# Patient Record
Sex: Male | Born: 1995 | Race: White | Hispanic: No | Marital: Single | Smoking: Never smoker
Health system: Southern US, Community
[De-identification: ages and names within clinical notes are randomized; demographics above are authoritative.]

## PROBLEM LIST (undated history)

## (undated) HISTORY — PX: TONSILLECTOMY: SUR1361

---
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
## 2017-04-26 ENCOUNTER — Encounter (HOSPITAL_COMMUNITY): Payer: Self-pay

## 2017-04-26 ENCOUNTER — Emergency Department (HOSPITAL_COMMUNITY): Payer: 59

## 2017-04-26 ENCOUNTER — Emergency Department (HOSPITAL_COMMUNITY)
Admission: EM | Admit: 2017-04-26 | Discharge: 2017-04-26 | Disposition: A | Discharge status: Home / Self Care | Payer: 59 | Attending: Emergency Medicine | Admitting: Emergency Medicine

## 2017-04-26 DIAGNOSIS — Y9367 Activity, basketball: Secondary | ICD-10-CM | POA: Diagnosis not present

## 2017-04-26 DIAGNOSIS — S93401A Sprain of unspecified ligament of right ankle, initial encounter: Secondary | ICD-10-CM | POA: Diagnosis not present

## 2017-04-26 DIAGNOSIS — Y92310 Basketball court as the place of occurrence of the external cause: Secondary | ICD-10-CM | POA: Insufficient documentation

## 2017-04-26 DIAGNOSIS — Y998 Other external cause status: Secondary | ICD-10-CM | POA: Insufficient documentation

## 2017-04-26 DIAGNOSIS — X509XXA Other and unspecified overexertion or strenuous movements or postures, initial encounter: Secondary | ICD-10-CM | POA: Diagnosis not present

## 2017-04-26 DIAGNOSIS — S99911A Unspecified injury of right ankle, initial encounter: Secondary | ICD-10-CM | POA: Diagnosis present

## 2017-04-26 MED ORDER — TRAMADOL HCL 50 MG PO TABS: 50.0000 mg | ORAL_TABLET | Freq: Four times a day (QID) | ORAL | 0 refills | Status: DC | PRN

## 2017-04-26 MED ORDER — IBUPROFEN 800 MG PO TABS: 800.0000 mg | ORAL_TABLET | Freq: Three times a day (TID) | ORAL | 0 refills | Status: DC

## 2017-04-26 NOTE — ED Triage Notes (Signed)
Rolled his right ankle while playing basketball last evening.  Pain and swelling present.

## 2017-04-26 NOTE — ED Notes (Signed)
Pt took 800mg  Motrin apprx @ 0015 this morning for the pain.

## 2017-04-26 NOTE — ED Provider Notes (Signed)
[facility] Provider Note   CSN: [id number] Arrival date & time: 04/26/17  0054     History   Chief Complaint Chief Complaint  Patient presents with  . Ankle Injury    HPI Charles Chung is a 21 y.o. male.  Patient presents for evaluation of right ankle injury.  Patient reports he was playing basketball approximately 5 hours ago when he rolled his right ankle.  Patient reports that it he heard a crunch when he rolled it and he has been having severe pain since.  He cannot bear weight because of pain.      History reviewed. No pertinent past medical history.  There are no active problems to display for this patient.   Past Surgical History:  Procedure Laterality Date  . TONSILLECTOMY         Home Medications    Prior to Admission medications   Medication Sig Start Date End Date Taking? Authorizing Provider  diphenhydrAMINE (BENADRYL) 25 MG tablet Take 25 mg by mouth every 6 (six) hours as needed. For itching/swelling    [provider]  ibuprofen (ADVIL,MOTRIN) 800 MG tablet Take 1 tablet (800 mg total) by mouth 3 (three) times daily. 04/26/17   Gilda CreasePollina, Amariyana Heacox J, MD  Poison Ivy Treatments GEL Apply 1 application topically 3 (three) times daily as needed. For itching    [provider]  predniSONE (DELTASONE) 20 MG tablet 3 Tabs PO Days 1-3, then 2 tabs PO Days 4-6, then 1 tab PO Day 7-9, then Half Tab PO Day 10-12 04/09/15   Renne CriglerGeiple, Joshua, PA-C  traMADol (ULTRAM) 50 MG tablet Take 1 tablet (50 mg total) by mouth every 6 (six) hours as needed. 04/26/17   Gilda CreasePollina, Rosabell Geyer J, MD    Family History No family history on file.  Social History Social History   Tobacco Use  . Smoking status: Never Smoker  . Smokeless tobacco: Never Used  Substance Use Topics  . Alcohol use: No  . Drug use: No     Allergies   Patient has no known allergies.   Review of Systems Review of Systems  Musculoskeletal: Positive  for arthralgias.  Skin: Negative.      Physical Exam Updated Vital Signs BP (!) 137/55 (BP Location: Right Arm)   Pulse 93   Temp 99.1 F (37.3 C) (Oral)   Resp 15   Ht 6\' 1"  (1.854 m)   Wt 99.8 kg (220 lb)   SpO2 100%   BMI 29.03 kg/m   Physical Exam  Constitutional: He appears well-nourished.  HENT:  Head: Atraumatic.  Eyes: EOM are normal.  Pulmonary/Chest: Effort normal.  Musculoskeletal:       Right knee: Normal. He exhibits no swelling. No tenderness found.       Right ankle: He exhibits decreased range of motion and swelling. He exhibits no deformity. Tenderness. No lateral malleolus, no medial malleolus and no proximal fibula tenderness found. Achilles tendon normal. Achilles tendon exhibits normal Thompson's test results.       Feet:     ED Treatments / Results  Labs (all labs ordered are listed, but only abnormal results are displayed) Labs Reviewed - No data to display  EKG  EKG Interpretation None       Radiology Dg Ankle Complete Right  Result Date: 04/26/2017 CLINICAL DATA:  Pain following rolling injury EXAM: RIGHT ANKLE - COMPLETE 3+ VIEW COMPARISON:  None. FINDINGS: Frontal, oblique, and lateral views were obtained. There is mild soft tissue  swelling. There is an apparent tiny avulsion arising from the lateral malleolus. No other evident fracture. No joint effusion. No appreciable joint space narrowing or erosion. Ankle mortise appears intact. IMPRESSION: Soft tissue swelling. Tiny avulsion arising from lateral malleolus inferiorly. No other evidence suggesting fracture. Ankle mortise appears intact. Electronically Signed   By: Bretta BangWilliam  Woodruff III M.D.   On: 04/26/2017 01:29    Procedures Procedures (including critical care time)  Medications Ordered in ED Medications - No data to display   Initial Impression / Assessment and Plan / ED Course  I have reviewed the triage vital signs and the nursing notes.  Pertinent labs & imaging results  that were available during my care of the patient were reviewed by me and considered in my medical decision making (see chart for details).     Presents to the ER for evaluation of inversion ankle injury.  Patient with anterior ankle tenderness and swelling, no fifth metatarsal tenderness, no proximal fibula tenderness, no malleolar tenderness.  X-ray negative with possible tiny avulsion off of lateral malleolus, although he is particularly tender there.  Will place in a Cam walker and have follow-up with orthopedics.  Final Clinical Impressions(s) / ED Diagnoses   Final diagnoses:  Sprain of right ankle, unspecified ligament, initial encounter    ED Discharge Orders        Ordered    ibuprofen (ADVIL,MOTRIN) 800 MG tablet  3 times daily     04/26/17 0133    traMADol (ULTRAM) 50 MG tablet  Every 6 hours PRN     04/26/17 0133       Gilda CreasePollina, Evalynn Hankins J, MD 04/26/17 [phone]

## 2020-10-04 ENCOUNTER — Emergency Department (HOSPITAL_COMMUNITY): Payer: 59

## 2020-10-04 ENCOUNTER — Other Ambulatory Visit: Payer: Self-pay

## 2020-10-04 ENCOUNTER — Encounter (HOSPITAL_COMMUNITY): Payer: Self-pay | Admitting: *Deleted

## 2020-10-04 ENCOUNTER — Emergency Department (HOSPITAL_COMMUNITY)
Admission: EM | Admit: 2020-10-04 | Discharge: 2020-10-04 | Disposition: A | Discharge status: Home / Self Care | Payer: 59 | Attending: Emergency Medicine | Admitting: Emergency Medicine

## 2020-10-04 DIAGNOSIS — K5900 Constipation, unspecified: Secondary | ICD-10-CM | POA: Insufficient documentation

## 2020-10-04 NOTE — ED Triage Notes (Signed)
Last BM 2 days ago

## 2020-10-04 NOTE — ED Provider Notes (Signed)
[facility] EMERGENCY DEPARTMENT Provider Note   CSN: [id number] Arrival date & time: 10/04/20  1339     History Chief Complaint  Patient presents with  . Constipation    Charles Chung is a 25 y.o. male.  The history is provided by the patient.  Constipation Severity:  Moderate Time since last bowel movement:  2 weeks Timing:  Intermittent Progression:  Waxing and waning Chronicity:  New Context: dietary changes   Stool description: hard, then watery after laxative. Relieved by:  Stool softeners and laxatives Associated symptoms: no abdominal pain, no anorexia, no back pain, no diarrhea, no dysuria, no fever, no flatus, no nausea, no urinary retention and no vomiting  Hematochezia: once.        History reviewed. No pertinent past medical history.  There are no problems to display for this patient.   Past Surgical History:  Procedure Laterality Date  . TONSILLECTOMY         No family history on file.  Social History   Tobacco Use  . Smoking status: Never Smoker  . Smokeless tobacco: Never Used  Substance Use Topics  . Alcohol use: No  . Drug use: No    Home Medications Prior to Admission medications   Medication Sig Start Date End Date Taking? Authorizing Provider  diphenhydrAMINE (BENADRYL) 25 MG tablet Take 25 mg by mouth every 6 (six) hours as needed. For itching/swelling    [provider]  ibuprofen (ADVIL,MOTRIN) 800 MG tablet Take 1 tablet (800 mg total) by mouth 3 (three) times daily. 04/26/17   Gilda Crease, MD  Poison Ivy Treatments GEL Apply 1 application topically 3 (three) times daily as needed. For itching    [provider]  predniSONE (DELTASONE) 20 MG tablet 3 Tabs PO Days 1-3, then 2 tabs PO Days 4-6, then 1 tab PO Day 7-9, then Half Tab PO Day 10-12 04/09/15   Renne Crigler, PA-C  traMADol (ULTRAM) 50 MG tablet Take 1 tablet (50 mg total) by mouth every 6 (six) hours as needed. 04/26/17   Gilda Crease, MD    Allergies    Patient has no known allergies.  Review of Systems   Review of Systems  Constitutional: Negative for fever.  Gastrointestinal: Positive for constipation. Negative for abdominal pain, anorexia, diarrhea, flatus, nausea and vomiting. Hematochezia: once.  Genitourinary: Negative for dysuria.  Musculoskeletal: Negative for back pain.    Physical Exam Updated Vital Signs BP (!) 150/70 (BP Location: Right Arm)   Pulse 70   Temp 98.7 F (37.1 C) (Oral)   Resp 18   Ht 6' (1.829 m)   Wt 96.6 kg   SpO2 100%   BMI 28.89 kg/m   Physical Exam Vitals and nursing note reviewed.  Constitutional:      General: He is not in acute distress.    Appearance: He is well-developed. He is not diaphoretic.  HENT:     Head: Normocephalic and atraumatic.  Eyes:     General: No scleral icterus.    Conjunctiva/sclera: Conjunctivae normal.  Cardiovascular:     Rate and Rhythm: Normal rate and regular rhythm.     Heart sounds: Normal heart sounds.  Pulmonary:     Effort: Pulmonary effort is normal. No respiratory distress.     Breath sounds: Normal breath sounds.  Abdominal:     Palpations: Abdomen is soft.     Tenderness: There is no abdominal tenderness.  Musculoskeletal:     Cervical back: Normal range  of motion and neck supple.  Skin:    General: Skin is warm and dry.  Neurological:     Mental Status: He is alert.  Psychiatric:        Behavior: Behavior normal.     ED Results / Procedures / Treatments   Labs (all labs ordered are listed, but only abnormal results are displayed) Labs Reviewed - No data to display  EKG None  Radiology No results found.  Procedures Procedures   Medications Ordered in ED Medications - No data to display  ED Course  I have reviewed the triage vital signs and the nursing notes.  Pertinent labs & imaging results that were available during my care of the patient were reviewed by me and considered in my  medical decision making (see chart for details).    MDM Rules/Calculators/A&P                          Patient with c/o constipation. Large stool burden on plain film No evidence of bowel obstruction. Patient given multiple otc  Med recommendations, lifestyle and diet choices, f/u with GI if no improvement. Discussed return precautions.  Final Clinical Impression(s) / ED Diagnoses Final diagnoses:  None    Rx / DC Orders ED Discharge Orders    None       Arthor Captain, PA-C 10/04/20 1614    Vanetta Mulders, MD 10/06/20 [phone]

## 2020-10-04 NOTE — Discharge Instructions (Addendum)
Ultimate Cleanse by Nature's Secret (my recommendation) available  at [employer], whole foods, or on amazon Or Miralax- 1 cap in 10 oz water 3 times a day, or for a clean out 10 capfuls in 32 oz of water before bed.  INCREASE FIBROUS fruits and veggies INCREASE cardio (running and walking stimulate colonic contraction) INCREASE water to at least 100 oz of water daily

## 2021-10-05 IMAGING — DX DG ABDOMEN 1V
2 series · 2 of 2 positions shown · non-contrast
Comparison: Abdomen report 05/12/1998.

CLINICAL DATA: Constipation.  Bloating.

EXAM:
ABDOMEN - 1 VIEW

[abdomen kub (1 of 2)]
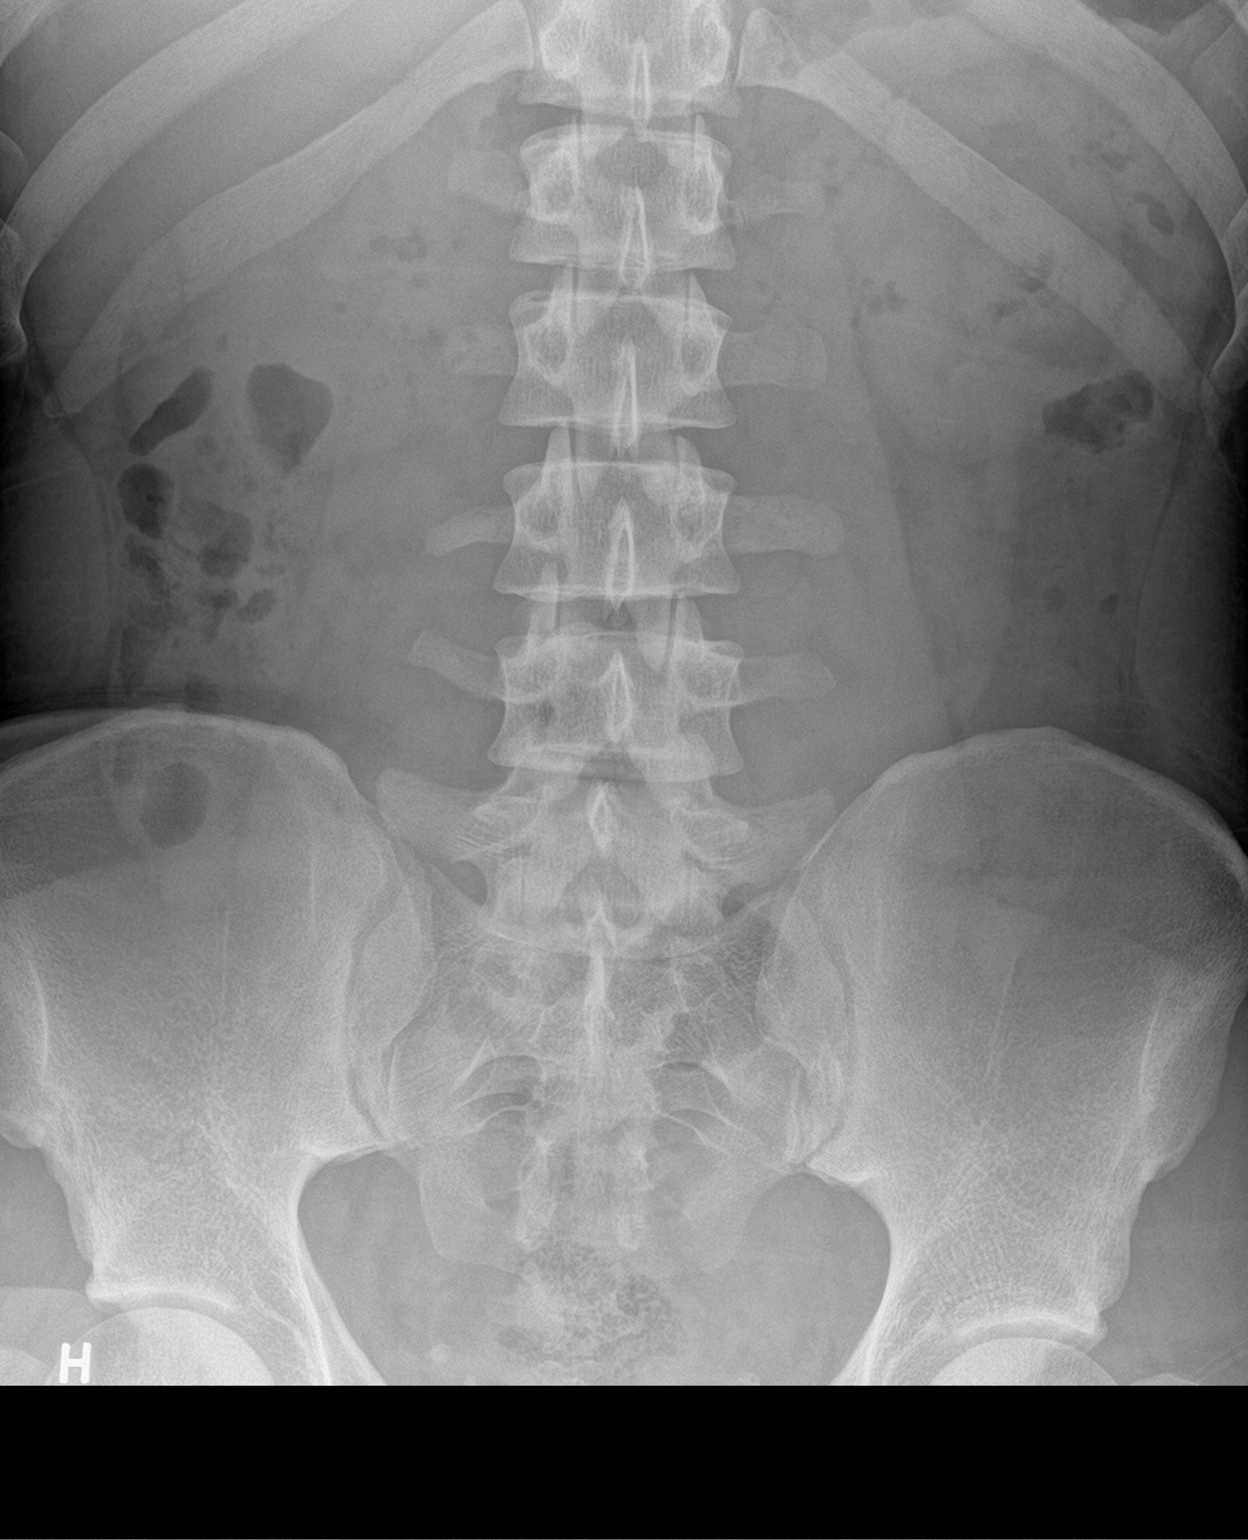

[abdomen kub (2 of 2)]
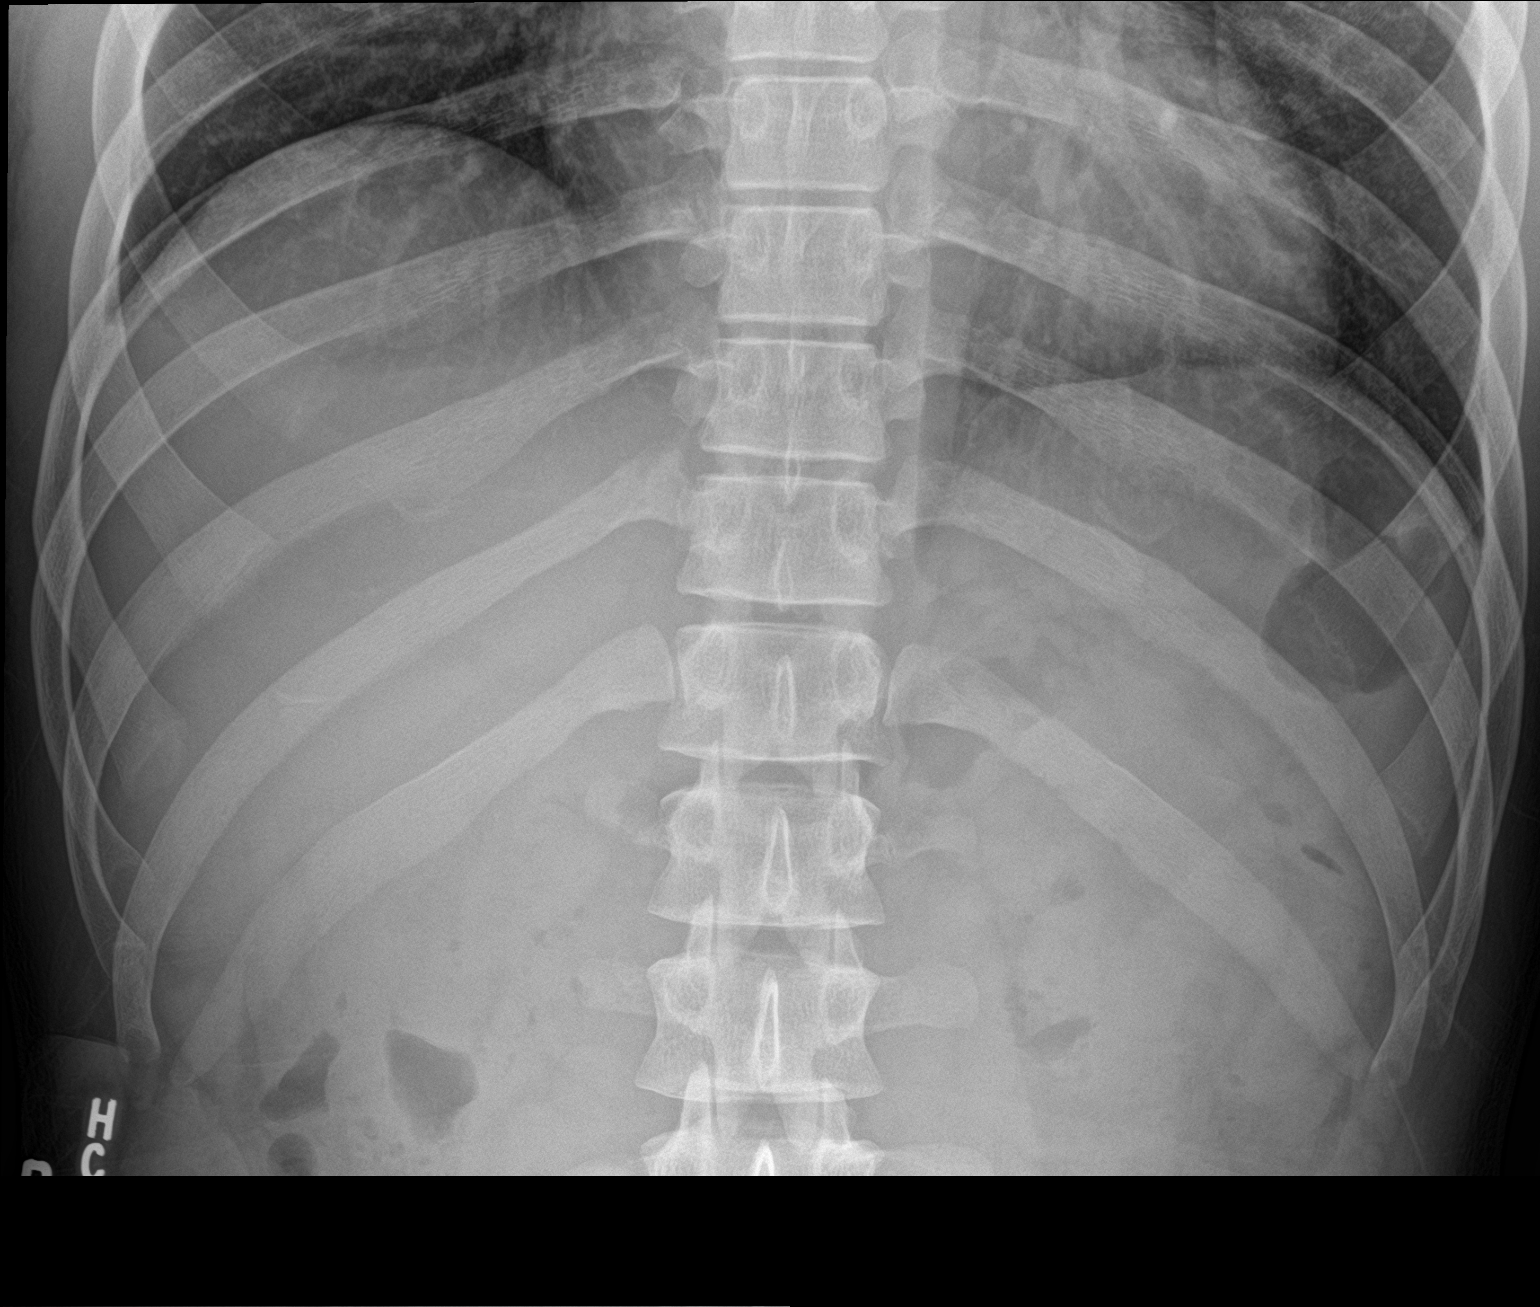

[2 of 2 positions shown; findings below may reference images not displayed]

FINDINGS: Soft tissue structures are unremarkable. No bowel distention. No
free air. Large amount of stool noted throughout the colon. Pelvic
calcifications consistent phleboliths. No acute bony abnormality.
IMPRESSION: Large amount of stool noted throughout the colon. No bowel
distention. No acute abnormality.

## 2022-08-30 ENCOUNTER — Ambulatory Visit (INDEPENDENT_AMBULATORY_CARE_PROVIDER_SITE_OTHER): Payer: Self-pay | Admitting: Podiatry

## 2022-08-30 ENCOUNTER — Encounter: Payer: Self-pay | Admitting: Podiatry

## 2022-08-30 ENCOUNTER — Ambulatory Visit (INDEPENDENT_AMBULATORY_CARE_PROVIDER_SITE_OTHER): Payer: 59

## 2022-08-30 DIAGNOSIS — M722 Plantar fascial fibromatosis: Secondary | ICD-10-CM

## 2022-08-30 DIAGNOSIS — D492 Neoplasm of unspecified behavior of bone, soft tissue, and skin: Secondary | ICD-10-CM

## 2022-08-30 DIAGNOSIS — M79672 Pain in left foot: Secondary | ICD-10-CM

## 2022-08-30 MED ORDER — TRIAMCINOLONE ACETONIDE 10 MG/ML IJ SUSP
10.0000 mg | Freq: Once | INTRAMUSCULAR | Status: AC
  Administered 2022-08-30: 10 mg

## 2022-08-30 NOTE — Progress Notes (Signed)
Subjective:   Patient ID: Charles Chung, male   DOB: 27 y.o.   MRN: [id number]   HPI Patient is with a small nodule plantar aspect left arch that is been present for 3 years and states it does get discomforting with pressure and it seems to be worse over the last few months.  Has not really seen it grow and it only gets irritated with standing.  Patient does not smoke likes to be active   Review of Systems  All other systems reviewed and are negative.       Objective:  Physical Exam Vitals and nursing note reviewed.  Constitutional:      Appearance: He is well-developed.  Pulmonary:     Effort: Pulmonary effort is normal.  Musculoskeletal:        General: Normal range of motion.  Skin:    General: Skin is warm.  Neurological:     Mental Status: He is alert.     Neurovascular status intact muscle strength found to be adequate range of motion adequate with patient found to have a small nodule within the left plantar arch that measures around 7 mm x 8 mm.  It does not appear to have any density to it and it does appear to be just inflammatory with no proximal issues or other pathology     Assessment:  Difficult to say what this may be but it appears to be more inflammatory and it is not really changed for 3 years     Plan:  H&P reviewed MRI of mass going to hold off currently, to try to get it better and if it responds we will just watch it and I went ahead today did do sterile prep injected the plantar fascia left 3 mg Dexasone Kenalog 5 mg Xylocaine and I went ahead and discussed if any changes were to occur or if it continues to hurt but I want to see it immediately.  Also begin warm compresses at home and I applied padding around the area to try to offload the discomfort.  Patient will be seen back as symptoms indicate hopefully this will solve the problem  X-rays indicate no calcification no indications of bony extensions or pathology

## 2022-09-04 ENCOUNTER — Other Ambulatory Visit: Payer: Self-pay | Admitting: Podiatry

## 2022-09-04 DIAGNOSIS — M722 Plantar fascial fibromatosis: Secondary | ICD-10-CM

## 2022-09-04 DIAGNOSIS — D492 Neoplasm of unspecified behavior of bone, soft tissue, and skin: Secondary | ICD-10-CM

## 2022-09-04 DIAGNOSIS — M79672 Pain in left foot: Secondary | ICD-10-CM

## 2022-10-29 ENCOUNTER — Ambulatory Visit: Payer: 59 | Admitting: Podiatry

## 2022-10-29 ENCOUNTER — Encounter: Payer: Self-pay | Admitting: Podiatry

## 2022-10-29 DIAGNOSIS — D1801 Hemangioma of skin and subcutaneous tissue: Secondary | ICD-10-CM | POA: Diagnosis not present

## 2022-10-30 NOTE — Progress Notes (Signed)
Subjective:   Patient ID: Charles Chung, male   DOB: 27 y.o.   MRN: [id number]   HPI Patient states he is still having problems with the bottom of his left foot and stated the injection only helped for a very temporary period of time and it is hard to walk on   ROS      Objective:  Physical Exam  Neurovascular status intact with the patient's left foot showing a discoloration and bulge in the plantar metatarsal proximal to the third metatarsal it is localized and sore to this area no proximal edema erythema drainage noted     Assessment:  Appears to be some form of cyst or other unknown soft tissue mass and I did show to Dr. Logan Bores who agreed and we agreed together that it would probably be best to open it up to make sure it is not infected     Plan:  H&P reviewed went ahead today did proximal nerve block and did nerve block of the whole plantar foot.  Then using a sharp sterile chisel I opened up the area a very small amount and actually found that there is fresh blood in the area which did have indications of some form of a AV fistula and I did at this time applied massive compression to get it to stop instructed on elevation and if it were to start again he is getting need to go to the emergency room and may need vascular surgery.  If this does not resolve problems what I did he may need to see a vascular surgeon also and have this area clotted and worked on and possibly cauterized or tied off.  Patient had significant dressing applied and that it was no longer bleeding when he left but he will be seen back immediately or let us know if any issues were to occur

## 2022-12-21 ENCOUNTER — Encounter: Payer: Self-pay | Admitting: Podiatry

## 2022-12-21 ENCOUNTER — Ambulatory Visit: Payer: 59 | Admitting: Podiatry

## 2022-12-21 DIAGNOSIS — I770 Arteriovenous fistula, acquired: Secondary | ICD-10-CM

## 2022-12-24 NOTE — Progress Notes (Signed)
Subjective:   Patient ID: Charles Chung, male   DOB: 27 y.o.   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  Reviewed this with him

## 2023-01-11 ENCOUNTER — Ambulatory Visit
Admission: RE | Admit: 2023-01-11 | Discharge: 2023-01-11 | Disposition: A | Discharge status: Home / Self Care | Payer: 59 | Source: Ambulatory Visit | Attending: Podiatry

## 2023-01-11 ENCOUNTER — Other Ambulatory Visit: Payer: Self-pay | Admitting: Podiatry

## 2023-01-11 DIAGNOSIS — I770 Arteriovenous fistula, acquired: Secondary | ICD-10-CM

## 2023-01-11 MED ORDER — GADOPICLENOL 0.5 MMOL/ML IV SOLN
10.0000 mL | Freq: Once | INTRAVENOUS | Status: AC | PRN
  Administered 2023-01-11: 10 mL via INTRAVENOUS

## 2023-02-25 NOTE — Progress Notes (Signed)
Kid who needs av malformation worked on

## 2023-02-26 ENCOUNTER — Telehealth: Payer: Self-pay | Admitting: Podiatry

## 2023-02-26 NOTE — Telephone Encounter (Signed)
-----   Message from Edwin Cap sent at 02/25/2023  8:56 AM EDT ----- Can you schedule him a f/u appt w/ me for surgery consult when Regal is here? ----- Message ----- From: Lenn Sink, DPM Sent: 02/25/2023   8:40 AM EDT To: Edwin Cap, DPM  Kid who needs av malformation worked on

## 2023-02-26 NOTE — Telephone Encounter (Signed)
Lvm for pt to call to schedule an appt to see Dr Lilian Kapur when Dr Charlsie Merles is also in the office.

## 2023-03-11 ENCOUNTER — Ambulatory Visit: Payer: 59 | Admitting: Podiatry

## 2023-03-11 ENCOUNTER — Encounter: Payer: Self-pay | Admitting: Podiatry

## 2023-03-11 DIAGNOSIS — Q278 Other specified congenital malformations of peripheral vascular system: Secondary | ICD-10-CM | POA: Diagnosis not present

## 2023-03-11 DIAGNOSIS — D492 Neoplasm of unspecified behavior of bone, soft tissue, and skin: Secondary | ICD-10-CM

## 2023-03-11 NOTE — Progress Notes (Signed)
Subjective:  Patient ID: Charles Chung, male    DOB: 1995-12-17,  MRN: [id number]  Chief Complaint  Patient presents with   Foot Pain    "It's doing fine now.  Once or twice I had the sharp pain that I was having when I first came here."    27 y.o. male presents with the above complaint. History confirmed with patient.  He is referred to me by Dr. Charlsie Merles for a soft tissue mass of the plantar left foot.  He said he has had it to some degree present for a number of years but has acutely worsened over the last couple months  Objective:  Physical Exam: warm, good capillary refill, no trophic changes or ulcerative lesions, normal DP and PT pulses, normal sensory exam, and plantar midfoot in the mid arch there is a soft tender soft tissue mass with ecchymotic mottled appearance.        Study Result  Narrative & Impression  CLINICAL DATA:  AV fistula of the left forefoot   EXAM: MRI OF THE LEFT FOREFOOT WITHOUT AND WITH CONTRAST   TECHNIQUE: Multiplanar, multisequence MR imaging of the left forefoot was performed both before and after administration of intravenous contrast.   CONTRAST:  10 mL Vueway, 0 mL wasted   COMPARISON:  None Available.   FINDINGS: Bones/Joint/Cartilage   No fracture or dislocation. Normal alignment. No joint effusion. No marrow signal abnormality.   Ligaments   Collateral ligaments are intact.  Lisfranc ligament is intact.   Muscles and Tendons   Flexor, peroneal and extensor compartment tendons are intact. Muscles are normal.   Soft tissue No fluid collection or hematoma. 1.3 x 0.8 x 3.1 cm lobulated T2 hyperintense mass with enhancement isointense to the adjacent vessels consistent with a vascular malformation. Low signal areas within the vascular malformation likely reflecting thrombosis or phleboliths.   IMPRESSION: 1. A 1.3 x 0.8 x 3.1 cm lobulated mass with enhancement isointense to the adjacent vessels consistent with a  vascular malformation. Low signal areas within the vascular malformation likely reflecting thrombosis or phleboliths.     Electronically Signed   By: Elige Ko M.D.   On: 01/29/2023 12:16   Assessment:   1. 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  Reviewed the results of the MRI.  We discussed the presence of the vascular malformation and the etiology and treatment options of these lesions and that they are typically benign lesions that have a low chance of recurrence with excision.  Surgical we discussed excision of the lesion through a direct plantar approach and we discussed due to the location the need for this and the need for limited weightbearing and use of crutches for approximately 3 weeks while his sutures heal.  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  We discussed the risk of recurrence the risk of infection and the risk of painful scar and/or nerve entrapment from surgery.  We also discussed the need for additional incisions to tie off any feeder veins.  All questions addressed.  Informed consent signed and reviewed.   Surgical plan:  Procedure: -Excision of vascular soft tissue mass left foot  Location: -[facility]  Anesthesia plan: -IV sedation with local  Postoperative pain plan: - Tylenol 1000 mg every 6 hours, ibuprofen 600 mg every  6 hours, gabapentin 300 mg every 8 hours x5 days, oxycodone 5 mg 1-2 tabs every 6 hours only as needed  DVT prophylaxis: -None required  WB Restrictions / DME needs: -NWB in surgical shoe with crutches   No follow-ups on file.

## 2023-04-15 ENCOUNTER — Telehealth: Payer: Self-pay | Admitting: Podiatry

## 2023-04-15 NOTE — Telephone Encounter (Signed)
DOS-05/17/23  EXC. SOFT TISSUE MASS [id number]  [facility] EFFECTIVE DATE-05/07/2022  DEDUCTIBLE- $500.00 WITH REMAINING $500.00 OOP- $3000.00 WITH REMAINING $2850.00 COINSURANCE- 20%  PER THE [employer] WEBSITE PORTAL, PRIOR AUTH IS NOT REQUIRED FOR CPT CODE 40981.  AUTH Decision ID #: [id number]

## 2023-04-15 NOTE — Telephone Encounter (Signed)
Completed and e-mailed FMLA paperwork to Mr. Yongue employer - [email or web address]  .Marland Kitchen...  Called patient and advised the same .Marland Kitchen...    J. Abbott -- 04/15/2023

## 2023-05-17 ENCOUNTER — Other Ambulatory Visit: Payer: Self-pay | Admitting: Podiatry

## 2023-05-17 DIAGNOSIS — D492 Neoplasm of unspecified behavior of bone, soft tissue, and skin: Secondary | ICD-10-CM | POA: Diagnosis not present

## 2023-05-17 DIAGNOSIS — Q278 Other specified congenital malformations of peripheral vascular system: Secondary | ICD-10-CM | POA: Diagnosis not present

## 2023-05-17 DIAGNOSIS — M79672 Pain in left foot: Secondary | ICD-10-CM

## 2023-05-17 MED ORDER — GABAPENTIN 300 MG PO CAPS: 300.0000 mg | ORAL_CAPSULE | Freq: Three times a day (TID) | ORAL | 0 refills | Status: DC

## 2023-05-17 MED ORDER — ACETAMINOPHEN 500 MG PO TABS: 1000.0000 mg | ORAL_TABLET | Freq: Four times a day (QID) | ORAL | 0 refills | Status: AC | PRN

## 2023-05-17 MED ORDER — TRAMADOL HCL 50 MG PO TABS: 50.0000 mg | ORAL_TABLET | Freq: Four times a day (QID) | ORAL | 0 refills | Status: AC | PRN

## 2023-05-17 MED ORDER — IBUPROFEN 600 MG PO TABS: 600.0000 mg | ORAL_TABLET | Freq: Four times a day (QID) | ORAL | 0 refills | Status: AC | PRN

## 2023-05-17 NOTE — Progress Notes (Signed)
 05/17/23 Mass excision

## 2023-05-23 ENCOUNTER — Ambulatory Visit (INDEPENDENT_AMBULATORY_CARE_PROVIDER_SITE_OTHER): Payer: 59 | Admitting: Podiatry

## 2023-05-23 ENCOUNTER — Encounter: Payer: Self-pay | Admitting: Podiatry

## 2023-05-23 VITALS — Ht 72.0 in | Wt 213.0 lb

## 2023-05-23 DIAGNOSIS — Q278 Other specified congenital malformations of peripheral vascular system: Secondary | ICD-10-CM

## 2023-05-23 NOTE — Progress Notes (Signed)
  Subjective:  Patient ID: Charles Chung, male    DOB: 05-Oct-1995,  MRN: [id number]  Chief Complaint  Patient presents with   Routine Post Op    POV # 1 DOS 05/17/23 --- EXC OF LEFT FOOT SOFT TISSUE MASS     28 y.o. male returns for post-op check.  Doing well he has not had much pain  Review of Systems: Negative except as noted in the HPI. Denies N/V/F/Ch.   Objective:  There were no vitals filed for this visit. Body mass index is 28.89 kg/m. Constitutional Well developed. Well nourished.  Vascular Foot warm and well perfused. Capillary refill normal to all digits.  Calf is soft and supple, no posterior calf or knee pain, negative Homans' sign  Neurologic Normal speech. Oriented to person, place, and time. Epicritic sensation to light touch grossly present bilaterally.  Dermatologic Skin healing well without signs of infection. Skin edges well coapted without signs of infection.  Orthopedic: Minimal pain to palpation noted about the surgical site.    Assessment:   1. Vascular malformation of lower extremity    Plan:  Patient was evaluated and treated and all questions answered.  S/p foot surgery left -Progressing as expected post-operatively.  Continue nonweightbearing in CAM boot.  Foot redressed.  May shower on Monday.  Return in 2 weeks to have sutures removed.   Return in about 2 weeks (around 06/06/2023) for suture removal.

## 2023-05-27 ENCOUNTER — Other Ambulatory Visit: Payer: Self-pay | Admitting: Podiatry

## 2023-06-03 ENCOUNTER — Telehealth: Payer: Self-pay | Admitting: Podiatry

## 2023-06-03 NOTE — Telephone Encounter (Signed)
Patient called as requested from Dr. Lilian Kapur if any incident occurs to call immediately/Patient fell on left foot.

## 2023-06-06 ENCOUNTER — Encounter: Payer: Self-pay | Admitting: Podiatry

## 2023-06-06 ENCOUNTER — Ambulatory Visit (INDEPENDENT_AMBULATORY_CARE_PROVIDER_SITE_OTHER): Payer: 59 | Admitting: Podiatry

## 2023-06-06 ENCOUNTER — Telehealth: Payer: Self-pay | Admitting: Podiatry

## 2023-06-06 VITALS — Ht 72.0 in | Wt 213.0 lb

## 2023-06-06 DIAGNOSIS — D1801 Hemangioma of skin and subcutaneous tissue: Secondary | ICD-10-CM

## 2023-06-06 NOTE — Progress Notes (Signed)
  Subjective:  Patient ID: Charles Chung, male    DOB: 02/03/1996,  MRN: [id number]  Chief Complaint  Patient presents with   Routine Post Op    POV # 2 DOS 05/17/23 --- EXC OF LEFT FOOT SOFT TISSUE MASS, he reports he felt a pop after a fall the other day like a stitch come out     28 y.o. male returns for post-op check.  He is concerned about an area in the middle of the incision  Review of Systems: Negative except as noted in the HPI. Denies N/V/F/Ch.   Objective:  There were no vitals filed for this visit. Body mass index is 28.89 kg/m. Constitutional Well developed. Well nourished.  Vascular Foot warm and well perfused. Capillary refill normal to all digits.  Calf is soft and supple, no posterior calf or knee pain, negative Homans' sign  Neurologic Normal speech. Oriented to person, place, and time. Epicritic sensation to light touch grossly present bilaterally.  Dermatologic Majority of well-healed and not hypertrophic.  The central portion of incision has slight maceration and no deep dehiscence  Orthopedic: Minimal pain to palpation noted about the surgical site.   Pathology results showed hemangioma of left foot benign  Assessment:   1. Hemangioma of subcutaneous tissue     Plan:  Patient was evaluated and treated and all questions answered.  S/p foot surgery left -Most sutures removed.  Unfortunate has delayed healing in the central portion.  Recommended he discontinue use of Aquaphor and apply Betadine to stitches daily.  Return in 1 week to have removed we will have to delay his return to work until the Monday after next visit on February 10   Return in about 1 week (around 06/13/2023) for suture removal.

## 2023-06-06 NOTE — Telephone Encounter (Signed)
Pt called and stated you wanted him to be out of work for another week but he forgot to get a letter. When are you wanting the pt to go back to work? Please advise

## 2023-06-06 NOTE — Telephone Encounter (Signed)
Pt wanted letter emailed to him at [email or web address].  I have emailed to pt.

## 2023-06-13 ENCOUNTER — Encounter: Payer: Self-pay | Admitting: Podiatry

## 2023-06-13 ENCOUNTER — Ambulatory Visit (INDEPENDENT_AMBULATORY_CARE_PROVIDER_SITE_OTHER): Payer: 59 | Admitting: Podiatry

## 2023-06-13 DIAGNOSIS — D1801 Hemangioma of skin and subcutaneous tissue: Secondary | ICD-10-CM

## 2023-06-13 NOTE — Progress Notes (Signed)
  Subjective:  Patient ID: Charles Chung, male    DOB: [birth year],  MRN: [id number]  Chief Complaint  Patient presents with   Routine Post Op    Patient states everything has been fine since last visit , patient sometimes feels a burning feeling where the stiches are, But feels no pain .     28 y.o. male returns for post-op check.   Review of Systems: Negative except as noted in the HPI. Denies N/V/F/Ch.   Objective:  There were no vitals filed for this visit. There is no height or weight on file to calculate BMI. Constitutional Well developed. Well nourished.  Vascular Foot warm and well perfused. Capillary refill normal to all digits.  Calf is soft and supple, no posterior calf or knee pain, negative Homans' sign  Neurologic Normal speech. Oriented to person, place, and time. Epicritic sensation to light touch grossly present bilaterally.  Dermatologic Much improved appearance and is healed without drainage  Orthopedic: Minimal pain to palpation noted about the surgical site.   Pathology results showed hemangioma of left foot benign  Assessment:   1. Hemangioma of subcutaneous tissue      Plan:  Patient was evaluated and treated and all questions answered.  S/p foot surgery left -Much improved.  Remaining suture removed.  Continues Neosporin and/or Betadine depending on drainage but should resolve this point.  Once scab has resolved can begin using scar cream or Mederma.  May return to work without restrictions on February 17   Return in about 5 weeks (around 07/18/2023) for post op (no x-rays).

## 2023-06-27 ENCOUNTER — Encounter: Payer: 59 | Admitting: Podiatry

## 2023-07-18 ENCOUNTER — Ambulatory Visit (INDEPENDENT_AMBULATORY_CARE_PROVIDER_SITE_OTHER): Payer: 59 | Admitting: Podiatry

## 2023-07-18 ENCOUNTER — Encounter: Payer: Self-pay | Admitting: Podiatry

## 2023-07-18 DIAGNOSIS — D1801 Hemangioma of skin and subcutaneous tissue: Secondary | ICD-10-CM

## 2023-07-18 NOTE — Progress Notes (Signed)
  Subjective:  Patient ID: Charles Chung, male    DOB: 10/17/1995,  MRN: [id number]  Chief Complaint  Patient presents with   Follow-up    Patient states everything has been going good, he has been back to his normal other then a little pain on the dorsal of his left foot but nothing major     28 y.o. male returns for post-op check.   Review of Systems: Negative except as noted in the HPI. Denies N/V/F/Ch.   Objective:  There were no vitals filed for this visit. There is no height or weight on file to calculate BMI. Constitutional Well developed. Well nourished.  Vascular Foot warm and well perfused. Capillary refill normal to all digits.  Calf is soft and supple, no posterior calf or knee pain, negative Homans' sign no recurrence of mass  Neurologic Normal speech. Oriented to person, place, and time. Epicritic sensation to light touch grossly present bilaterally.  Dermatologic No drainage scar is well-healed with minimal hypertrophy  Orthopedic: He has no pain pain to palpation noted about the surgical site.   Pathology results showed hemangioma of left foot benign  Assessment:   1. Hemangioma of subcutaneous tissue       Plan:  Patient was evaluated and treated and all questions answered.  S/p foot surgery left -Doing well and is fully healed.  We discussed activity and the scar massage and he will continue to work on this.  Nontender currently.  If any of the thickness does not resolve and is problematic long-term return to see me as needed for scar injection.  Follow-up.   Return if symptoms worsen or fail to improve.
